# Patient Record
Sex: Female | Born: 1965 | Race: White | Hispanic: No | Marital: Married | Smoking: Never smoker
Health system: Southern US, Community
[De-identification: ages and names within clinical notes are randomized; demographics above are authoritative.]

## PROBLEM LIST (undated history)

## (undated) DIAGNOSIS — R51 Headache: Secondary | ICD-10-CM

## (undated) DIAGNOSIS — D561 Beta thalassemia: Secondary | ICD-10-CM

## (undated) DIAGNOSIS — G8929 Other chronic pain: Secondary | ICD-10-CM

## (undated) DIAGNOSIS — Z3183 Encounter for assisted reproductive fertility procedure cycle: Secondary | ICD-10-CM

## (undated) DIAGNOSIS — T7840XA Allergy, unspecified, initial encounter: Secondary | ICD-10-CM

## (undated) DIAGNOSIS — G4700 Insomnia, unspecified: Secondary | ICD-10-CM

## (undated) DIAGNOSIS — R519 Headache, unspecified: Secondary | ICD-10-CM

## (undated) DIAGNOSIS — D569 Thalassemia, unspecified: Secondary | ICD-10-CM

## (undated) DIAGNOSIS — D131 Benign neoplasm of stomach: Secondary | ICD-10-CM

## (undated) DIAGNOSIS — J302 Other seasonal allergic rhinitis: Secondary | ICD-10-CM

## (undated) HISTORY — PX: WISDOM TOOTH EXTRACTION: SHX21

## (undated) HISTORY — PX: OTHER SURGICAL HISTORY: SHX169

## (undated) HISTORY — DX: Headache, unspecified: R51.9

## (undated) HISTORY — DX: Other chronic pain: G89.29

## (undated) HISTORY — DX: Headache: R51

## (undated) HISTORY — PX: UPPER GASTROINTESTINAL ENDOSCOPY: SHX188

## (undated) HISTORY — DX: Beta thalassemia: D56.1

## (undated) HISTORY — DX: Thalassemia, unspecified: D56.9

## (undated) HISTORY — DX: Other seasonal allergic rhinitis: J30.2

## (undated) HISTORY — DX: Allergy, unspecified, initial encounter: T78.40XA

## (undated) HISTORY — DX: Encounter for assisted reproductive fertility procedure cycle: Z31.83

## (undated) HISTORY — DX: Benign neoplasm of stomach: D13.1

---

## 1998-07-11 ENCOUNTER — Inpatient Hospital Stay (HOSPITAL_COMMUNITY): Admission: AD | Admit: 1998-07-11 | Discharge: 1998-07-11 | Payer: Self-pay | Admitting: Gynecology

## 1998-10-06 ENCOUNTER — Inpatient Hospital Stay (HOSPITAL_COMMUNITY): Admission: AD | Admit: 1998-10-06 | Discharge: 1998-10-06 | Payer: Self-pay | Admitting: Gynecology

## 1998-10-07 ENCOUNTER — Inpatient Hospital Stay (HOSPITAL_COMMUNITY): Admission: AD | Admit: 1998-10-07 | Discharge: 1998-10-07 | Payer: Self-pay | Admitting: Gynecology

## 1998-10-08 ENCOUNTER — Inpatient Hospital Stay (HOSPITAL_COMMUNITY): Admission: AD | Admit: 1998-10-08 | Discharge: 1998-10-08 | Payer: Self-pay | Admitting: Gynecology

## 1998-10-10 ENCOUNTER — Inpatient Hospital Stay (HOSPITAL_COMMUNITY): Admission: AD | Admit: 1998-10-10 | Discharge: 1998-10-10 | Payer: Self-pay | Admitting: Gynecology

## 1998-11-08 ENCOUNTER — Other Ambulatory Visit: Admission: RE | Admit: 1998-11-08 | Discharge: 1998-11-08 | Payer: Self-pay | Admitting: Gynecology

## 1998-12-06 ENCOUNTER — Other Ambulatory Visit: Admission: RE | Admit: 1998-12-06 | Discharge: 1998-12-06 | Payer: Self-pay | Admitting: Gynecology

## 1999-01-19 ENCOUNTER — Inpatient Hospital Stay (HOSPITAL_COMMUNITY): Admission: AD | Admit: 1999-01-19 | Discharge: 1999-01-19 | Payer: Self-pay | Admitting: Gynecology

## 1999-01-20 ENCOUNTER — Inpatient Hospital Stay (HOSPITAL_COMMUNITY): Admission: AD | Admit: 1999-01-20 | Discharge: 1999-01-20 | Payer: Self-pay | Admitting: Gynecology

## 1999-01-21 ENCOUNTER — Inpatient Hospital Stay (HOSPITAL_COMMUNITY): Admission: AD | Admit: 1999-01-21 | Discharge: 1999-01-21 | Payer: Self-pay | Admitting: Gynecology

## 1999-01-23 ENCOUNTER — Inpatient Hospital Stay (HOSPITAL_COMMUNITY): Admission: AD | Admit: 1999-01-23 | Discharge: 1999-01-23 | Payer: Self-pay | Admitting: Gynecology

## 1999-01-31 ENCOUNTER — Inpatient Hospital Stay (HOSPITAL_COMMUNITY): Admission: AD | Admit: 1999-01-31 | Discharge: 1999-01-31 | Payer: Self-pay | Admitting: Gynecology

## 1999-05-11 ENCOUNTER — Inpatient Hospital Stay (HOSPITAL_COMMUNITY): Admission: AD | Admit: 1999-05-11 | Discharge: 1999-05-11 | Payer: Self-pay | Admitting: Gynecology

## 1999-05-12 ENCOUNTER — Inpatient Hospital Stay (HOSPITAL_COMMUNITY): Admission: AD | Admit: 1999-05-12 | Discharge: 1999-05-12 | Payer: Self-pay | Admitting: Gynecology

## 1999-05-13 ENCOUNTER — Inpatient Hospital Stay (HOSPITAL_COMMUNITY): Admission: AD | Admit: 1999-05-13 | Discharge: 1999-05-13 | Payer: Self-pay | Admitting: Gynecology

## 2000-01-07 ENCOUNTER — Other Ambulatory Visit: Admission: RE | Admit: 2000-01-07 | Discharge: 2000-01-07 | Payer: Self-pay | Admitting: Gynecology

## 2000-04-24 ENCOUNTER — Encounter: Payer: Self-pay | Admitting: *Deleted

## 2000-04-24 ENCOUNTER — Inpatient Hospital Stay (HOSPITAL_COMMUNITY): Admission: AD | Admit: 2000-04-24 | Discharge: 2000-04-24 | Payer: Self-pay | Admitting: Gynecology

## 2001-02-11 ENCOUNTER — Other Ambulatory Visit: Admission: RE | Admit: 2001-02-11 | Discharge: 2001-02-11 | Payer: Self-pay | Admitting: Obstetrics and Gynecology

## 2001-02-16 ENCOUNTER — Encounter: Admission: RE | Admit: 2001-02-16 | Discharge: 2001-02-16 | Payer: Self-pay | Admitting: Obstetrics and Gynecology

## 2001-02-16 ENCOUNTER — Encounter: Payer: Self-pay | Admitting: Obstetrics and Gynecology

## 2001-08-21 ENCOUNTER — Inpatient Hospital Stay (HOSPITAL_COMMUNITY): Admission: AD | Admit: 2001-08-21 | Discharge: 2001-08-21 | Payer: Self-pay | Admitting: Obstetrics and Gynecology

## 2001-08-21 ENCOUNTER — Inpatient Hospital Stay (HOSPITAL_COMMUNITY): Admission: AD | Admit: 2001-08-21 | Discharge: 2001-08-26 | Payer: Self-pay | Admitting: Obstetrics and Gynecology

## 2001-08-27 ENCOUNTER — Encounter: Admission: RE | Admit: 2001-08-27 | Discharge: 2001-09-26 | Payer: Self-pay | Admitting: Obstetrics and Gynecology

## 2001-08-29 ENCOUNTER — Inpatient Hospital Stay (HOSPITAL_COMMUNITY): Admission: AD | Admit: 2001-08-29 | Discharge: 2001-08-29 | Payer: Self-pay | Admitting: Obstetrics and Gynecology

## 2001-09-17 ENCOUNTER — Encounter: Payer: Self-pay | Admitting: Obstetrics and Gynecology

## 2001-09-17 ENCOUNTER — Encounter: Admission: RE | Admit: 2001-09-17 | Discharge: 2001-09-17 | Payer: Self-pay | Admitting: Obstetrics and Gynecology

## 2001-10-08 ENCOUNTER — Other Ambulatory Visit: Admission: RE | Admit: 2001-10-08 | Discharge: 2001-10-08 | Payer: Self-pay | Admitting: Obstetrics and Gynecology

## 2002-10-14 ENCOUNTER — Other Ambulatory Visit: Admission: RE | Admit: 2002-10-14 | Discharge: 2002-10-14 | Payer: Self-pay | Admitting: Obstetrics and Gynecology

## 2003-10-27 ENCOUNTER — Other Ambulatory Visit: Admission: RE | Admit: 2003-10-27 | Discharge: 2003-10-27 | Payer: Self-pay | Admitting: Obstetrics and Gynecology

## 2004-09-26 ENCOUNTER — Other Ambulatory Visit: Admission: RE | Admit: 2004-09-26 | Discharge: 2004-09-26 | Payer: Self-pay | Admitting: Obstetrics and Gynecology

## 2005-07-17 ENCOUNTER — Ambulatory Visit: Payer: Self-pay | Admitting: Gastroenterology

## 2005-07-31 ENCOUNTER — Encounter (INDEPENDENT_AMBULATORY_CARE_PROVIDER_SITE_OTHER): Payer: Self-pay | Admitting: Specialist

## 2005-07-31 ENCOUNTER — Ambulatory Visit: Payer: Self-pay | Admitting: Gastroenterology

## 2005-10-01 ENCOUNTER — Other Ambulatory Visit: Admission: RE | Admit: 2005-10-01 | Discharge: 2005-10-01 | Payer: Self-pay | Admitting: Obstetrics and Gynecology

## 2008-06-15 ENCOUNTER — Ambulatory Visit: Payer: Self-pay | Admitting: Gastroenterology

## 2008-06-15 DIAGNOSIS — K219 Gastro-esophageal reflux disease without esophagitis: Secondary | ICD-10-CM | POA: Insufficient documentation

## 2008-06-16 ENCOUNTER — Encounter: Payer: Self-pay | Admitting: Gastroenterology

## 2008-06-16 ENCOUNTER — Telehealth: Payer: Self-pay | Admitting: Gastroenterology

## 2009-08-23 ENCOUNTER — Telehealth: Payer: Self-pay | Admitting: Gastroenterology

## 2010-03-12 ENCOUNTER — Encounter: Admission: RE | Admit: 2010-03-12 | Discharge: 2010-03-12 | Payer: Self-pay | Admitting: Obstetrics and Gynecology

## 2010-11-18 ENCOUNTER — Encounter: Payer: Self-pay | Admitting: Obstetrics and Gynecology

## 2011-03-15 NOTE — Discharge Summary (Signed)
[facility]  Patient:    DARSI, TIEN Visit Number: [id number] MRN: [id number]          Service Type: OBS Location: 910A [id number] [id number] Attending Physician:  Frederich Balding Dictated by:   Danie Chandler, R.N. Admit Date:  08/21/2001 Discharge Date: 08/26/2001                             Discharge Summary  ADMISSION DIAGNOSIS:          Intrauterine pregnancy at term with spontaneous onset of labor.  DISCHARGE DIAGNOSIS:          Intrauterine pregnancy at term with spontaneous onset of labor with failure to descend.  PROCEDURES:                   On August 22, 2001, primary low transverse cesarean section.  REASON FOR ADMISSION:         The patient is a 45 year old, gravida 3, para 0, married, white female who presented at 47 weeks with spontaneous onset of labor.  She progressed to complete dilatation.  After two-and-a-half hours of pushing, the fetal vertex remained arrested at 0 to +1 station with minimal descent.  After discussion of options, including vacuum extraction, it was decided to proceed with a primary cesarean section.  HOSPITAL COURSE:              The patient was taken to the operating room and underwent the above-named procedure without complication.  This was productive of a viable female infant with Apgars of 9 at one minute and 9 at five minutes. Postoperatively on day #1, the patient was stable.  Her hemoglobin on this day was 8.0, hematocrit 24.3, and white blood cell count 13.8.  On postoperative day #2, the patient was complaining of gas pain.  She was tolerating a regular diet without nausea or vomiting.  She did have a good return of bowel function.  Her hemoglobin was stable at 8.3.  On postoperative day #3, she continued with some gas pain, but did feel better as the  day progressed.  DISPOSITION:                  On postoperative day #4, she was discharged home.  CONDITION ON DISCHARGE:       Good.  DIET:                          Regular as tolerated.  FOLLOW-UP:                    She is to follow up in the office in one to two weeks for an incision check.  SPECIAL INSTRUCTIONS:         She is to call for a temperature greater than 100 degrees, persistent nausea or vomiting, heavy vaginal bleeding, and/or redness or drainage from the incision site.  DISCHARGE MEDICATIONS:        1. Prenatal vitamins one p.o. q.d.                               2. Motrin 600 mg, #30, one p.o. q.6h. p.r.n.  pain.                               3. Percocet 5 mg, #40, one to two p.o. q.4h.                                  p.r.n. pain. Dictated by:   Danie Chandler, R.N. Attending Physician:  Frederich Balding DD:  08/26/01 TD:  08/26/01 Job: [id number]

## 2011-03-15 NOTE — Op Note (Signed)
[facility]  Patient:    Angela Mayo, Angela Mayo Visit Number: [id number] MRN: [id number]          Service Type: OBS Location: [facility]C Attending Physician:  Frederich Balding Dictated by:   Juluis Mire, M.D. Proc. Date: 08/22/01 Admit Date:  08/21/2001 Discharge Date: 08/21/2001                             Operative Report  PREOPERATIVE DIAGNOSIS:       Intrauterine pregnancy at term with failure to                               descend.  POSTOPERATIVE DIAGNOSIS:      Intrauterine pregnancy at term with failure to                               descend.  OPERATION:                    Low transverse cesarean section.  SURGEON:                      Juluis Mire, M.D.  ANESTHESIA:                   Epidural.  ESTIMATED BLOOD LOSS:         800 cc.  PACKS AND DRAINS:             None.  INTRAOPERATIVE BLOOD PRODUCTS:                     None.  COMPLICATIONS:                None.  INDICATIONS:                  A 45 year old gravida 3, para 0, abortus 2, married white female who presents at 34 weeks with spontaneous onset of labor. Progressed to complete dilatation.  After 2-1/2 hours of pushing, the fetal vertex remained arrestive at 0 to +1 station with minimal descent.  After discussion of options including vacuum extraction, it was decided to proceed with primary cesarean section.  The risks were discussed including the risks of anesthesia, the risks of infection, the risks of hemorrhage that could necessitate transfusion, the risks of AIDS or hepatitis, the risks of injury to adjacent organs, the risks of deep venous thrombosis, and pulmonary embolus.  DESCRIPTION OF PROCEDURE:     The patient was taken to the OR and placed in the supine position with left lateral tilt.  After a satisfactory level of epidural anesthesia was obtained, the abdomen was prepped out with Betadine and draped as a sterile field.  A low transverse skin incision was  made with the knife and carried through the subcutaneous tissue.  The anterior rectus fascia was entered sharply and the incision ________ laterally.  The fascia was then taken off the muscle superiorly and inferiorly.  Rectus muscles were separated in the midline.  The anterior peritoneum was entered sharply ______ the peritoneum extended both superiorly and inferiorly.  A low transverse bladder flap was easily developed.  A low transverse uterine incision was developed with the knife and extended laterally using Mayo traction.  The infant presented in the vertex presentation.  With total elevation  of the head and fundal pressure, there was a double loop of nuchal cord that was tied around the neck.  The placenta was then delivered manually.  The uterus was wiped free of remaining membranes and placenta.  The uterus was then closed with an interlocking suture of 0 chromic using two layer closure technique. Areas of bleeding brought under control with figure-of-eights of 0 chromic. Tubes and ovaries were unremarkable.  The pelvic cavity was thoroughly irrigated and hemostasis had been excellent.  The muscles were reapproximated with a running suture of 3-0 Vicryl.  The fascia was closed with a running suture of 0 PDS.  The skin was closed with staples and Steri-Strips.  Urine output was slightly blood tinged at the time of closure.  Sponge, instrument, and needle counts was correct by the circulating nurse.  The patient tolerated the procedure well and was returned to the recovery room in good condition. Dictated by:   Juluis Mire, M.D. Attending Physician:  Frederich Balding DD:  08/22/01 TD:  08/24/01 Job: [id number]

## 2011-03-15 NOTE — H&P (Signed)
[facility]  Patient:    Angela Mayo, Angela Mayo                        MRN: [id number] Adm. Date:  [id number] Attending:  Rolinda Roan CC:         Leatha Gilding. Mezer, M.D.                         History and Physical  HISTORY OF PRESENT ILLNESS:   The patient is a 45 year old, gravida 2, para 0, ho underwent embryo transfer on March 29, 2000, in association with Charna Elizabeth, M.D. of [place]. She is seen locally by Leatha Gilding. Mezer, M.D.  The patient began some  cramping and spotting on the afternoon of April 23, 2000, which became bright red, then brownish, and then more consistently red and heavier in the afternoon of June 28.  She presented in maternity admissions unit at [facility] at approximately 1815 on April 24, 2000.  Beta hCG at this time was 2637 as opposed to previous level of approximately 360 on April 17, 2000.  Examination of the patient revealed well-nourished white female in no acute distress.  PHYSICAL EXAMINATION:  VITAL SIGNS:                  Blood pressure 112/65, pulse ranged from 75 to 94. Temperature 98.8 to 99.5.  ABDOMEN:                      Flat and soft.  PELVIC:                       External genitalia, Bartholins, urethra, and Skenes glands showed slight red blood present.  Speculum examination revealed small amount of reddish blood present in the vagina.  Cervix was closed.  There was no active bleeding occurring.  Bimanual examination revealed the uterus to be midposterior and top normal size.  There were no palpable adnexal mass, nor was there any tenderness.  Ultrasound was obtained which failed to show an intrauterine pregnancy at this time, but did not show any adnexal mass or suggestion of ectopic pregnancy or of fluid in the cul-de-sac.  IMPRESSION:                   1. Positive beta hCG.                               2. Question intrauterine pregnancy.  3. Abnormal uterine bleeding.  DISPOSITION:                  The patient was advised to return home and remain at bed rest with bathroom privileges.  She is to call the office on the morning of  June 29, to consult with Leatha Gilding. Mezer, M.D. and staff regarding her situation. She was to proceed to [facility] to see Dr. Neil Crouch for an ultrasound on June 29; however, it was advised that because of the lack of definite fetal pole or development of a fetus on the ultrasound at this time, she might remain in town and consult with Dimas Aguas C. Mezer, M.D.  She is to call or return to maternity admissions unit if further heavy bleeding or cramps should occur.  She is to continue her progesterone suppositories as ordered. DD:  04/24/00 TD:  04/25/00 Job: [id number]

## 2011-10-31 ENCOUNTER — Telehealth: Payer: Self-pay | Admitting: Gastroenterology

## 2011-10-31 NOTE — Telephone Encounter (Signed)
Patient c/o pain that started in the fall after she went for a walk.  She reports that the pain is worse with position changes standing up or sitting down.  Once she is standing or sitting the pain subsides.  She was told by her primary care MD that she had a distended colon on an [place] that was performed in her office.  She has a history of endometriosis and had exp lap about 16 years ago.  She was told that she had a lot of gas and stool in her colon and was told to take Miralax.  She reports she is not constipated and the miralax just made her stools very soft and had several a day.  She denies diarrhea, constipation, nausea, vomiting or fever.  She does take an OTC digestive enzyme with each meal and this helps with her indigestion.  She will keep the appt on 11/11/11, I will place her on the cancellation list.

## 2011-11-11 ENCOUNTER — Ambulatory Visit (INDEPENDENT_AMBULATORY_CARE_PROVIDER_SITE_OTHER): Payer: BC Managed Care – PPO | Admitting: Gastroenterology

## 2011-11-11 ENCOUNTER — Other Ambulatory Visit (INDEPENDENT_AMBULATORY_CARE_PROVIDER_SITE_OTHER): Payer: BC Managed Care – PPO

## 2011-11-11 ENCOUNTER — Encounter: Payer: Self-pay | Admitting: Gastroenterology

## 2011-11-11 VITALS — BP 108/64 | HR 72 | Ht 70.0 in | Wt 154.0 lb

## 2011-11-11 DIAGNOSIS — R141 Gas pain: Secondary | ICD-10-CM

## 2011-11-11 DIAGNOSIS — R142 Eructation: Secondary | ICD-10-CM

## 2011-11-11 DIAGNOSIS — R143 Flatulence: Secondary | ICD-10-CM

## 2011-11-11 DIAGNOSIS — R1032 Left lower quadrant pain: Secondary | ICD-10-CM

## 2011-11-11 LAB — COMPREHENSIVE METABOLIC PANEL
ALT: 15 U/L (ref 0–35)
AST: 17 U/L (ref 0–37)
Albumin: 4.4 g/dL (ref 3.5–5.2)
CO2: 27 mEq/L (ref 19–32)
Calcium: 8.9 mg/dL (ref 8.4–10.5)
Creatinine, Ser: 0.6 mg/dL (ref 0.4–1.2)
Glucose, Bld: 88 mg/dL (ref 70–99)
Sodium: 135 mEq/L (ref 135–145)
Total Bilirubin: 0.8 mg/dL (ref 0.3–1.2)

## 2011-11-11 LAB — CBC WITH DIFFERENTIAL/PLATELET
Basophils Absolute: 0 10*3/uL (ref 0.0–0.1)
Eosinophils Relative: 3.4 % (ref 0.0–5.0)
HCT: 36.6 % (ref 36.0–46.0)
Hemoglobin: 11.7 g/dL — ABNORMAL LOW (ref 12.0–15.0)
Lymphs Abs: 1.8 10*3/uL (ref 0.7–4.0)
MCV: 65.7 fl — ABNORMAL LOW (ref 78.0–100.0)
Monocytes Absolute: 0.6 10*3/uL (ref 0.1–1.0)
Neutro Abs: 3.5 10*3/uL (ref 1.4–7.7)
Platelets: 230 10*3/uL (ref 150.0–400.0)
RDW: 15.4 % — ABNORMAL HIGH (ref 11.5–14.6)

## 2011-11-11 MED ORDER — ALIGN 4 MG PO CAPS: 1.0000 | ORAL_CAPSULE | Freq: Every day | ORAL | Status: DC

## 2011-11-11 NOTE — Patient Instructions (Addendum)
Go directly to the basement to have your labs drawn today. Start Align samples one tablet by mouth once daily x 1 month.  Follow instructions on Hemoccult cards and mail back to [place] when finished. Low gas diet given. Patient advised to avoid spicy, acidic, citrus, chocolate, mints, fruit and fruit juices.  Limit the intake of caffeine, alcohol and Soda.  Don't exercise too soon after eating.  Don't lie down within 3-4 hours of eating.  Elevate the head of your bed. cc: Julio Sicks, NP

## 2011-11-11 NOTE — Progress Notes (Signed)
History of Present Illness: This is a 46 year old female seen in the past for GERD. She has had chronic problems with postprandial bloating and increased gas. The symptoms are well controlled with dietary measures and a digestive enzyme. Acid suppressants have not been effective in controlling her gas and bloating. She developed left hip and left lower quadrant abdominal pain that is brought on by movement, bending and certain positions of her left hip. Does not appear to have any relationship to any digestive function. Denies weight loss, abdominal pain, constipation, diarrhea, change in stool caliber, melena, hematochezia, nausea, vomiting, dysphagia, reflux symptoms, chest pain.  No Known Allergies No outpatient prescriptions prior to visit.   Past Medical History  Diagnosis Date  . GERD (gastroesophageal reflux disease)   . Beta thalassemia   . Seasonal allergies   . Chronic headaches   . Benign fundic gland polyps of stomach   . Endometriosis    Past Surgical History  Procedure Date  . Cesarean section    History   Social History  . Marital Status: Married    Spouse Name: N/A    Number of Children: N/A  . Years of Education: N/A   Occupational History  . Violinist with [employer]    Social History Main Topics  . Smoking status: Never Smoker   . Smokeless tobacco: Never Used  . Alcohol Use: Yes     occasional  . Drug Use: No  . Sexually Active: None   Other Topics Concern  . None   Social History Narrative   Daily caffeine    Family History  Problem Relation Age of Onset  . Adopted: Yes  . Pancreatic cancer Maternal Uncle   . Colon cancer Neg Hx     Review of Systems: Pertinent positive and negative review of systems were noted in the above HPI section. All other review of systems were otherwise negative.   Physical Exam: General: Well developed , well nourished, no acute distress Head: Normocephalic and atraumatic Eyes:  sclerae anicteric,  EOMI Ears: Normal auditory acuity Mouth: No deformity or lesions Neck: Supple, no masses or thyromegaly Lungs: Clear throughout to auscultation Heart: Regular rate and rhythm; no murmurs, rubs or bruits Abdomen: Soft, non tender and non distended. Tenderness localized to left iliac crest. No masses, hepatosplenomegaly or hernias noted. Normal Bowel sounds Musculoskeletal: Symmetrical with no gross deformities  Skin: No lesions on visible extremities Pulses:  Normal pulses noted Extremities: No clubbing, cyanosis, edema or deformities noted Neurological: Alert oriented x 4, grossly nonfocal Cervical Nodes:  No significant cervical adenopathy Inguinal Nodes: No significant inguinal adenopathy Psychological:  Alert and cooperative. Normal mood and affect  Assessment and Recommendations:  1. Left hip region musculoskeletal pain. Does not appear to be a gastrointestinal process. Obtain routine blood work and stool Hemoccults. Consider further evaluation with an abdominal/pelvic CT scan. Further followup with her primary physician. Consider orthopedic evaluation.  2. Bloating and intestinal gas. She is given information on antireflux measures and a low gas diet. Trial of Align and Restora probiotics. Followup as needed.

## 2011-11-12 LAB — IBC PANEL: Iron: 118 ug/dL (ref 42–145)

## 2013-03-19 ENCOUNTER — Other Ambulatory Visit: Payer: Self-pay | Admitting: Obstetrics and Gynecology

## 2013-03-26 ENCOUNTER — Other Ambulatory Visit: Payer: Self-pay | Admitting: Obstetrics and Gynecology

## 2013-03-26 DIAGNOSIS — R928 Other abnormal and inconclusive findings on diagnostic imaging of breast: Secondary | ICD-10-CM

## 2013-04-05 ENCOUNTER — Other Ambulatory Visit: Payer: BC Managed Care – PPO

## 2013-04-08 ENCOUNTER — Ambulatory Visit
Admission: RE | Admit: 2013-04-08 | Discharge: 2013-04-08 | Disposition: A | Discharge status: Home / Self Care | Payer: BC Managed Care – PPO | Source: Ambulatory Visit | Attending: Obstetrics and Gynecology | Admitting: Obstetrics and Gynecology

## 2013-04-08 DIAGNOSIS — R928 Other abnormal and inconclusive findings on diagnostic imaging of breast: Secondary | ICD-10-CM

## 2013-09-29 ENCOUNTER — Emergency Department (INDEPENDENT_AMBULATORY_CARE_PROVIDER_SITE_OTHER): Payer: BC Managed Care – PPO

## 2013-09-29 ENCOUNTER — Encounter: Payer: Self-pay | Admitting: Emergency Medicine

## 2013-09-29 ENCOUNTER — Emergency Department
Admission: EM | Admit: 2013-09-29 | Discharge: 2013-09-29 | Disposition: A | Discharge status: Home / Self Care | Payer: BC Managed Care – PPO | Source: Home / Self Care | Attending: Family Medicine | Admitting: Family Medicine

## 2013-09-29 DIAGNOSIS — M79609 Pain in unspecified limb: Secondary | ICD-10-CM

## 2013-09-29 DIAGNOSIS — M7989 Other specified soft tissue disorders: Secondary | ICD-10-CM

## 2013-09-29 DIAGNOSIS — S93609A Unspecified sprain of unspecified foot, initial encounter: Secondary | ICD-10-CM

## 2013-09-29 DIAGNOSIS — S93601A Unspecified sprain of right foot, initial encounter: Secondary | ICD-10-CM

## 2013-09-29 HISTORY — DX: Insomnia, unspecified: G47.00

## 2013-09-29 MED ORDER — IBUPROFEN 800 MG PO TABS
800.0000 mg | ORAL_TABLET | Freq: Once | ORAL | Status: AC
  Administered 2013-09-29: 800 mg via ORAL

## 2013-09-29 MED ORDER — HYDROCODONE-ACETAMINOPHEN 5-325 MG PO TABS: ORAL_TABLET | ORAL | Status: DC

## 2013-09-29 MED ORDER — MELOXICAM 15 MG PO TABS: 15.0000 mg | ORAL_TABLET | Freq: Every day | ORAL | Status: DC

## 2013-09-29 NOTE — ED Provider Notes (Signed)
CSN: [id number]     Arrival date & time 09/29/13  6962 History   First MD Initiated Contact with Patient 09/29/13 [id number]     Chief Complaint  Patient presents with  . Foot Injury      HPI Comments: Patient twisted her right foot getting out of bed last night, with subsequent pain/swelling dorsally.   Patient is a 47 y.o. female presenting with foot injury. The history is provided by the patient and the spouse.  Foot Injury Location:  Foot Time since incident:  8 hours Injury: yes   Mechanism of injury comment:  Twisted foot Foot location:  R foot Pain details:    Quality:  Aching   Radiates to:  Does not radiate   Severity:  Moderate   Onset quality:  Sudden   Duration:  8 hours   Timing:  Constant   Progression:  Unchanged Chronicity:  New Dislocation: no   Prior injury to area:  No Relieved by:  NSAIDs Worsened by:  Bearing weight Ineffective treatments:  None tried Associated symptoms: decreased ROM, stiffness and swelling   Associated symptoms: no back pain, no muscle weakness, no numbness and no tingling     Past Medical History  Diagnosis Date  . GERD (gastroesophageal reflux disease)   . Beta thalassemia   . Seasonal allergies   . Chronic headaches   . Benign fundic gland polyps of stomach   . Endometriosis   . Insomnia    Past Surgical History  Procedure Laterality Date  . Cesarean section     Family History  Problem Relation Age of Onset  . Adopted: Yes  . Pancreatic cancer Maternal Uncle   . Colon cancer Neg Hx    History  Substance Use Topics  . Smoking status: Never Smoker   . Smokeless tobacco: Never Used  . Alcohol Use: No     Comment: occasional   OB History   Grav Para Term Preterm Abortions TAB SAB Ect Mult Living                 Review of Systems  Musculoskeletal: Positive for stiffness. Negative for back pain.  All other systems reviewed and are negative.    Allergies  Review of patient's allergies indicates no known  allergies.  Home Medications   Current Outpatient Rx  Name  Route  Sig  Dispense  Refill  . traZODone (DESYREL) 150 MG tablet   Oral   Take by mouth at bedtime.         . Digestive Enzymes CAPS   Oral   Take by mouth. Takes one capsule before every meal         . HYDROcodone-acetaminophen (NORCO/VICODIN) 5-325 MG per tablet      Take one by mouth at bedtime as needed for pain   10 tablet   0   . meloxicam (MOBIC) 15 MG tablet   Oral   Take 1 tablet (15 mg total) by mouth daily. Take with food each morning   15 tablet   0   . Probiotic Product (ALIGN) 4 MG CAPS   Oral   Take 1 capsule by mouth daily.   30 capsule   0     Samples given to patient    Lot#[id number]         ...    BP 97/62  Pulse 78  Temp(Src) 98 F (36.7 C) (Oral)  Resp 16  Ht 5\' 10"  (1.778 m)  Wt 133 lb (60.328  kg)  BMI 19.08 kg/m2  SpO2 99%  LMP 09/05/2013 Physical Exam  Nursing note and vitals reviewed. Constitutional: She is oriented to person, place, and time. She appears well-developed and well-nourished. No distress.  HENT:  Head: Normocephalic and atraumatic.  Eyes: Conjunctivae are normal. Pupils are equal, round, and reactive to light.  Musculoskeletal:       Right foot: She exhibits decreased range of motion, tenderness, bony tenderness and swelling. She exhibits normal capillary refill, no crepitus, no deformity and no laceration.       Feet:  There is tenderness/swelling dorsum as noted on diagram.  Neurological: She is alert and oriented to person, place, and time.  Skin: Skin is warm and dry.    ED Course  Procedures  none    Imaging Review Dg Foot Complete Right  09/29/2013   CLINICAL DATA:  Foot injury.  Pain and swelling  EXAM: RIGHT FOOT COMPLETE - 3+ VIEW  COMPARISON:  None.  FINDINGS: There is no evidence of fracture or dislocation. There is no evidence of arthropathy or other focal bone abnormality. Soft tissues are unremarkable.  IMPRESSION: Negative.    Electronically Signed   By: Marlan Palau M.D.   On: 09/29/2013 10:55      MDM   1. Right foot sprain, initial encounter    Ace wrap applied.  Rx for Mobic.15mg .  Apply ice pack for 30 minutes every 1 to 2 hours today and tomorrow.  Elevate.  Use crutches for 3 to 5 days.  Wear Ace wrap until swelling decreases.  Begin range of motion and stretching exercises in about 5 days as per instruction sheet.  Followup with [facility] if not improving about two weeks.    Lattie Haw, MD 10/02/13 1316

## 2013-09-29 NOTE — ED Notes (Addendum)
Pt c/o RT foot injury x last night. She reports that she tripped during the night going to the restroom at home. She took advil at 19.

## 2013-10-03 ENCOUNTER — Telehealth: Payer: Self-pay

## 2013-10-03 NOTE — ED Notes (Signed)
Left a message on voice mail asking how patient is feeling and advising to call back with any questions or concerns.  

## 2014-02-14 ENCOUNTER — Other Ambulatory Visit: Payer: Self-pay

## 2014-02-14 DIAGNOSIS — Z1231 Encounter for screening mammogram for malignant neoplasm of breast: Secondary | ICD-10-CM

## 2014-03-31 ENCOUNTER — Encounter (INDEPENDENT_AMBULATORY_CARE_PROVIDER_SITE_OTHER): Payer: Self-pay

## 2014-03-31 ENCOUNTER — Ambulatory Visit
Admission: RE | Admit: 2014-03-31 | Discharge: 2014-03-31 | Disposition: A | Discharge status: Home / Self Care | Payer: BC Managed Care – PPO | Source: Ambulatory Visit

## 2014-03-31 DIAGNOSIS — Z1231 Encounter for screening mammogram for malignant neoplasm of breast: Secondary | ICD-10-CM

## 2014-05-06 DIAGNOSIS — M67911 Unspecified disorder of synovium and tendon, right shoulder: Secondary | ICD-10-CM | POA: Insufficient documentation

## 2014-05-06 DIAGNOSIS — M7551 Bursitis of right shoulder: Secondary | ICD-10-CM

## 2014-05-06 DIAGNOSIS — M719 Bursopathy, unspecified: Secondary | ICD-10-CM | POA: Insufficient documentation

## 2014-05-26 ENCOUNTER — Other Ambulatory Visit: Payer: Self-pay | Admitting: Obstetrics and Gynecology

## 2014-05-27 LAB — CYTOLOGY - PAP

## 2015-03-03 ENCOUNTER — Other Ambulatory Visit: Payer: Self-pay

## 2015-03-03 DIAGNOSIS — Z1231 Encounter for screening mammogram for malignant neoplasm of breast: Secondary | ICD-10-CM

## 2015-06-02 ENCOUNTER — Other Ambulatory Visit: Payer: Self-pay | Admitting: Obstetrics and Gynecology

## 2015-06-02 ENCOUNTER — Ambulatory Visit
Admission: RE | Admit: 2015-06-02 | Discharge: 2015-06-02 | Disposition: A | Discharge status: Home / Self Care | Payer: 59 | Source: Ambulatory Visit

## 2015-06-02 DIAGNOSIS — Z1231 Encounter for screening mammogram for malignant neoplasm of breast: Secondary | ICD-10-CM

## 2015-06-05 LAB — CYTOLOGY - PAP

## 2016-02-05 ENCOUNTER — Encounter: Payer: Self-pay | Admitting: Gastroenterology

## 2016-03-14 ENCOUNTER — Other Ambulatory Visit: Payer: Self-pay

## 2016-03-14 DIAGNOSIS — Z1231 Encounter for screening mammogram for malignant neoplasm of breast: Secondary | ICD-10-CM

## 2016-04-04 ENCOUNTER — Ambulatory Visit (AMBULATORY_SURGERY_CENTER): Payer: Self-pay

## 2016-04-04 VITALS — Ht 70.0 in | Wt 140.4 lb

## 2016-04-04 DIAGNOSIS — Z1211 Encounter for screening for malignant neoplasm of colon: Secondary | ICD-10-CM

## 2016-04-04 MED ORDER — SUPREP BOWEL PREP KIT 17.5-3.13-1.6 GM/177ML PO SOLN: 1.0000 | Freq: Once | ORAL | Status: DC

## 2016-04-04 NOTE — Progress Notes (Signed)
No allergies to eggs or soy No past problems with anesthesia No home oxygen No diet meds  Has email and internet; registered for emmi 

## 2016-04-18 ENCOUNTER — Ambulatory Visit (AMBULATORY_SURGERY_CENTER): Payer: BLUE CROSS/BLUE SHIELD | Admitting: Gastroenterology

## 2016-04-18 ENCOUNTER — Encounter: Payer: Self-pay | Admitting: Gastroenterology

## 2016-04-18 VITALS — BP 100/56 | HR 55 | Temp 98.6°F | Resp 13 | Ht 70.0 in | Wt 154.0 lb

## 2016-04-18 DIAGNOSIS — Z1211 Encounter for screening for malignant neoplasm of colon: Secondary | ICD-10-CM | POA: Diagnosis not present

## 2016-04-18 MED ORDER — SODIUM CHLORIDE 0.9 % IV SOLN: 500.0000 mL | INTRAVENOUS | Status: DC

## 2016-04-18 NOTE — Op Note (Addendum)
[facility] Patient Name: Angela Mayo Procedure Date: 04/18/2016 10:23 AM MRN: [id number] Endoscopist: Ladene Artist , MD Age: 50 Referring MD:  Date of Birth: 1966/06/20 Gender: Female Account #: [id number] Procedure:                Colonoscopy Indications:              Screening for colorectal malignant neoplasm Medicines:                Monitored Anesthesia Care Procedure:                Pre-Anesthesia Assessment:                           - Prior to the procedure, a History and Physical                            was performed, and patient medications and                            allergies were reviewed. The patient's tolerance of                            previous anesthesia was also reviewed. The risks                            and benefits of the procedure and the sedation                            options and risks were discussed with the patient.                            All questions were answered, and informed consent                            was obtained. Prior Anticoagulants: The patient has                            taken no previous anticoagulant or antiplatelet                            agents. ASA Grade Assessment: II - A patient with                            mild systemic disease. After reviewing the risks                            and benefits, the patient was deemed in                            satisfactory condition to undergo the procedure.                           After obtaining informed consent, the colonoscope  was passed under direct vision. Throughout the                            procedure, the patient's blood pressure, pulse, and                            oxygen saturations were monitored continuously. The                            Model PCF-[id number] scope was introduced                            through the anus and advanced to the the cecum,                            identified by  appendiceal orifice and ileocecal                            valve. The ileocecal valve, appendiceal orifice,                            and rectum were photographed. The quality of the                            bowel preparation was good. The colonoscopy was                            performed without difficulty. The patient tolerated                            the procedure well. Scope In: 10:39:41 AM Scope Out: 10:56:58 AM Scope Withdrawal Time: 0 hours 13 minutes 44 seconds  Total Procedure Duration: 0 hours 17 minutes 17 seconds  Findings:                 The entire examined colon appeared normal on direct                            and retroflexion views. Complications:            No immediate complications. Estimated blood loss:                            None. Estimated Blood Loss:     Estimated blood loss: none. Impression:               - The entire examined colon is normal on direct and                            retroflexion views.                           - No specimens collected. Recommendation:           - Repeat colonoscopy in 10 years for screening  purposes.                           - Patient has a contact number available for                            emergencies. The signs and symptoms of potential                            delayed complications were discussed with the                            patient. Return to normal activities tomorrow.                            Written discharge instructions were provided to the                            patient.                           - Resume previous diet.                           - Continue present medications. Ladene Artist, MD 04/18/2016 10:59:31 AM This report has been signed electronically.

## 2016-04-18 NOTE — Progress Notes (Signed)
Report to PACU, RN, vss, BBS= Clear.  

## 2016-04-18 NOTE — Patient Instructions (Signed)
YOU HAD AN ENDOSCOPIC PROCEDURE TODAY AT THE [facility]:   Refer to the procedure report that was given to you for any specific questions about what was found during the examination.  If the procedure report does not answer your questions, please call your gastroenterologist to clarify.  If you requested that your care partner not be given the details of your procedure findings, then the procedure report has been included in a sealed envelope for you to review at your convenience later.  YOU SHOULD EXPECT: Some feelings of bloating in the abdomen. Passage of more gas than usual.  Walking can help get rid of the air that was put into your GI tract during the procedure and reduce the bloating. If you had a lower endoscopy (such as a colonoscopy or flexible sigmoidoscopy) you may notice spotting of blood in your stool or on the toilet paper. If you underwent a bowel prep for your procedure, you may not have a normal bowel movement for a few days.  Please Note:  You might notice some irritation and congestion in your nose or some drainage.  This is from the oxygen used during your procedure.  There is no need for concern and it should clear up in a day or so.  SYMPTOMS TO REPORT IMMEDIATELY:   Following lower endoscopy (colonoscopy or flexible sigmoidoscopy):  Excessive amounts of blood in the stool  Significant tenderness or worsening of abdominal pains  Swelling of the abdomen that is new, acute  Fever of 100F or higher   For urgent or emergent issues, a gastroenterologist can be reached at any hour by calling [phone].   DIET: Your first meal following the procedure should be a small meal and then it is ok to progress to your normal diet. Heavy or fried foods are harder to digest and may make you feel nauseous or bloated.  Likewise, meals heavy in dairy and vegetables can increase bloating.  Drink plenty of fluids but you should avoid alcoholic beverages for 24  hours.  ACTIVITY:  You should plan to take it easy for the rest of today and you should NOT DRIVE or use heavy machinery until tomorrow (because of the sedation medicines used during the test).    FOLLOW UP: Our staff will call the number listed on your records the next business day following your procedure to check on you and address any questions or concerns that you may have regarding the information given to you following your procedure. If we do not reach you, we will leave a message.  However, if you are feeling well and you are not experiencing any problems, there is no need to return our call.  We will assume that you have returned to your regular daily activities without incident.  If any biopsies were taken you will be contacted by phone or by letter within the next 1-3 weeks.  Please call us at [phone] if you have not heard about the biopsies in 3 weeks.    SIGNATURES/CONFIDENTIALITY: You and/or your care partner have signed paperwork which will be entered into your electronic medical record.  These signatures attest to the fact that that the information above on your After Visit Summary has been reviewed and is understood.  Full responsibility of the confidentiality of this discharge information lies with you and/or your care-partner.   Resume medications. 

## 2016-04-18 NOTE — Progress Notes (Signed)
Pt reported that her birth mother had "a twisted colon and was unable to complete her colonoscopy".  Randall Hiss, RN reported this to Boyce Medici, [place] and she said Dr. Fuller Plan used a pediatric scope.  Also pt said her last BM in the waiting area was a light brownish color liquid.  She went in the rest room here in admitting and I looked at the result in the toilet which was a dark yellow color liquid.  Pt is very anxious about her prosedure and she had several questions.  I tried to answer all her questions and make her feel comfortable. maw

## 2016-04-19 ENCOUNTER — Telehealth: Payer: Self-pay | Admitting: *Deleted

## 2016-04-19 NOTE — Telephone Encounter (Signed)
  Follow up Call-  Call back number 04/18/2016  Post procedure Call Back phone  # [phone] hm  Permission to leave phone message Yes     Patient questions:  Do you have a fever, pain , or abdominal swelling? No. Pain Score  0 *  Have you tolerated food without any problems? Yes.    Have you been able to return to your normal activities? Yes.    Do you have any questions about your discharge instructions: Diet   No. Medications  No. Follow up visit  No.  Do you have questions or concerns about your Care? No.  Actions: * If pain score is 4 or above: No action needed, pain <4.

## 2016-06-24 ENCOUNTER — Other Ambulatory Visit: Payer: Self-pay | Admitting: Obstetrics and Gynecology

## 2016-06-24 ENCOUNTER — Ambulatory Visit
Admission: RE | Admit: 2016-06-24 | Discharge: 2016-06-24 | Disposition: A | Discharge status: Home / Self Care | Payer: BLUE CROSS/BLUE SHIELD | Source: Ambulatory Visit

## 2016-06-24 DIAGNOSIS — Z1231 Encounter for screening mammogram for malignant neoplasm of breast: Secondary | ICD-10-CM

## 2017-03-20 ENCOUNTER — Other Ambulatory Visit: Payer: Self-pay | Admitting: Obstetrics and Gynecology

## 2017-03-20 DIAGNOSIS — Z1231 Encounter for screening mammogram for malignant neoplasm of breast: Secondary | ICD-10-CM

## 2017-06-26 ENCOUNTER — Ambulatory Visit
Admission: RE | Admit: 2017-06-26 | Discharge: 2017-06-26 | Disposition: A | Discharge status: Home / Self Care | Payer: BLUE CROSS/BLUE SHIELD | Source: Ambulatory Visit | Attending: Obstetrics and Gynecology | Admitting: Obstetrics and Gynecology

## 2017-06-26 DIAGNOSIS — Z1231 Encounter for screening mammogram for malignant neoplasm of breast: Secondary | ICD-10-CM

## 2018-04-16 ENCOUNTER — Other Ambulatory Visit: Payer: Self-pay | Admitting: Obstetrics and Gynecology

## 2018-04-16 DIAGNOSIS — Z1231 Encounter for screening mammogram for malignant neoplasm of breast: Secondary | ICD-10-CM

## 2018-06-30 ENCOUNTER — Inpatient Hospital Stay: Admission: RE | Admit: 2018-06-30 | Payer: BLUE CROSS/BLUE SHIELD | Source: Ambulatory Visit

## 2018-11-02 ENCOUNTER — Other Ambulatory Visit: Payer: Self-pay

## 2018-11-02 ENCOUNTER — Encounter: Payer: Self-pay | Admitting: Podiatry

## 2018-11-02 ENCOUNTER — Ambulatory Visit (INDEPENDENT_AMBULATORY_CARE_PROVIDER_SITE_OTHER): Payer: 59 | Admitting: Podiatry

## 2018-11-02 VITALS — BP 108/67 | HR 76

## 2018-11-02 DIAGNOSIS — B351 Tinea unguium: Secondary | ICD-10-CM | POA: Diagnosis not present

## 2018-11-02 DIAGNOSIS — L603 Nail dystrophy: Secondary | ICD-10-CM

## 2018-11-02 NOTE — Progress Notes (Signed)
Subjective:   Patient ID: Angela Mayo, female   DOB: 53 y.o.   MRN: [id number]   HPI 53 year old female presents the office today for concerns of her right big toenail splitting as well as a green color to the nail.  She states that she plays tennis about once a week.  She was getting a pedicure when they noticed this and the person that was giving her the pedicure try to trim the nail and she was told to put alcohol on it for couple of days.  As is not getting better she presents today for further evaluation.   Review of Systems  All other systems reviewed and are negative.  Past Medical History:  Diagnosis Date  . Allergy   . Benign fundic gland polyps of stomach   . Beta thalassemia ([place])   . Chronic headaches   . Endometriosis   . In vitro fertilization   . Insomnia   . Seasonal allergies   . Thalassemia     Past Surgical History:  Procedure Laterality Date  . CESAREAN SECTION    . in vitro fertilization procedure    . umbilcal laproscopy    . UPPER GASTROINTESTINAL ENDOSCOPY    . WISDOM TOOTH EXTRACTION       Current Outpatient Medications:  .  meloxicam (MOBIC) 7.5 MG tablet, 1 po bid prn, Disp: , Rfl:  .  OVER THE COUNTER MEDICATION, Digestive enzymes, Disp: , Rfl:  .  traZODone (DESYREL) 150 MG tablet, Take by mouth at bedtime., Disp: , Rfl:   No Known Allergies     Objective:  Physical Exam  General: AAO x3, NAD  Dermatological: The right hallux toenail appears to be swelling on the distal aspect and there is some discoloration present of the nail.  Appears to have a green almost a bruised type appearance to it.  There is no pain in the nail upon palpation is no edema, erythema, drainage or pus.  The other toenails without discomfort or significant pathology.  Vascular: Dorsalis Pedis artery and Posterior Tibial artery pedal pulses are 2/4 bilateral with immedate capillary fill time. PThere is no pain with calf compression, swelling, warmth, erythema.    Neruologic: Grossly intact via light touch bilateral.Protective threshold with Semmes Wienstein monofilament intact to all pedal sites bilateral.   Musculoskeletal: No gross boney pedal deformities bilateral. No pain, crepitus, or limitation noted with foot and ankle range of motion bilateral. Muscular strength 5/5 in all groups tested bilateral.  Gait: Unassisted, Nonantalgic.      Assessment:   53 year old female right hallux onychodystrophy, discoloration     Plan:  -Treatment options discussed including all alternatives, risks, and complications -Etiology of symptoms were discussed -Likely due to damage however I did debride the toenails today that was loose and I sent this for pathology to [facility] labs for evaluation of the nail.  In the meantime we discussed we can continue with tea tree oil as well as finger soaks and I will get the results of the pathology.  Upon debridement of the area was able to remove most of the discoloration.  There is no extension into the underlying nail bed.  Trula Slade DPM

## 2018-11-16 ENCOUNTER — Telehealth: Payer: Self-pay | Admitting: *Deleted

## 2018-11-16 MED ORDER — NONFORMULARY OR COMPOUNDED ITEM: 5 refills | Status: DC

## 2018-11-16 NOTE — Telephone Encounter (Signed)
Left message requesting a call back to discuss results. 

## 2018-11-16 NOTE — Telephone Encounter (Signed)
Pt called and I informed pt of Dr. Leigh Aurora review of results and orders. Pt states she would like to do the topical and I informed [place] [phone], they would call with coverage and delivery.

## 2018-11-16 NOTE — Telephone Encounter (Signed)
-----   Message from Trula Slade, DPM sent at 11/16/2018  7:26 AM EST ----- Val- please let her know that the culture did show a fungus. I would start with a topical. Can you please order the topical through [facility] for nail fungus? We can always consider oral but I think she would do well with topical. Please let her know. Thanks.

## 2019-07-20 DIAGNOSIS — R519 Headache, unspecified: Secondary | ICD-10-CM | POA: Insufficient documentation

## 2019-12-29 ENCOUNTER — Telehealth: Payer: Self-pay | Admitting: *Deleted

## 2019-12-29 NOTE — Telephone Encounter (Signed)
Pt states she was seen in-office about a year ago for injuries to toenails during tennis, and 1 of the toenails is now green with a creamy appearance, tender to be pressed, and no drainage.

## 2019-12-29 NOTE — Telephone Encounter (Signed)
Left message for pt to make an appt, she may have an infection beneath the toenail.

## 2019-12-30 NOTE — Telephone Encounter (Signed)
Pt called and states the toenail looks like it has an infection and she didn't know if Dr. Jacqualyn Posey wanted to see her. I told pt that if the toenail looked like it had an infection he would want to see her, and she had not been evaluated in a year.

## 2019-12-30 NOTE — Telephone Encounter (Signed)
Pt called states she has more questions.

## 2019-12-30 NOTE — Telephone Encounter (Signed)
Left message informing pt that if she would call with her questions often I could call again with the answers.

## 2019-12-31 ENCOUNTER — Telehealth: Payer: Self-pay | Admitting: *Deleted

## 2019-12-31 ENCOUNTER — Encounter: Payer: Self-pay | Admitting: Podiatry

## 2019-12-31 ENCOUNTER — Telehealth: Payer: Self-pay | Admitting: Podiatry

## 2019-12-31 ENCOUNTER — Ambulatory Visit (INDEPENDENT_AMBULATORY_CARE_PROVIDER_SITE_OTHER): Payer: 59 | Admitting: Podiatry

## 2019-12-31 ENCOUNTER — Other Ambulatory Visit: Payer: Self-pay

## 2019-12-31 VITALS — Temp 97.8°F

## 2019-12-31 DIAGNOSIS — B351 Tinea unguium: Secondary | ICD-10-CM | POA: Diagnosis not present

## 2019-12-31 DIAGNOSIS — Z79899 Other long term (current) drug therapy: Secondary | ICD-10-CM | POA: Diagnosis not present

## 2019-12-31 NOTE — Telephone Encounter (Signed)
Left a voicemail message for patient to call me back with her PCP information and phone number.  Patient was seen today by Dr. Jacqualyn Posey. Dr. Jacqualyn Posey wants patient to have a CBC w/diff and Hepatic Function Panel done. Patient said she had lab results for this at her PCP office.   We do not have patient's correct PCP in the system. Once I receive a call back from patient, I will call PCP office up and ask for them to fax her lab work over to Dr. Jacqualyn Posey in the [place] office.  Waiting for a response.

## 2019-12-31 NOTE — Telephone Encounter (Signed)
Estill Bamberg:  Thank you. I called that office and they told me they have not done any blood work for them. They are not her PCP, It is her OBGYN.  I'm getting ready to go. I'll call them on Monday.  Thank you,  Rolly Pancake, CMA (AAMA)

## 2019-12-31 NOTE — Telephone Encounter (Signed)
Pt called Stated that her Dr. Is Dr. Tressia Danas and she did blood work around september last year.

## 2019-12-31 NOTE — Patient Instructions (Signed)
Terbinafine oral granules What is this medicine? TERBINAFINE (TER bin a feen) is an antifungal medicine. It is used to treat certain kinds of fungal or yeast infections. This medicine may be used for other purposes; ask your health care provider or pharmacist if you have questions. COMMON BRAND NAME(S): Lamisil What should I tell my health care provider before I take this medicine? They need to know if you have any of these conditions:  drink alcoholic beverages  kidney disease  liver disease  an unusual or allergic reaction to Terbinafine, other medicines, foods, dyes, or preservatives  pregnant or trying to get pregnant  breast-feeding How should I use this medicine? Take this medicine by mouth. Follow the directions on the prescription label. Hold packet with cut line on top. Shake packet gently to settle contents. Tear packet open along cut line, or use scissors to cut across line. Carefully pour the entire contents of packet onto a spoonful of a soft food, such as pudding or other soft, non-acidic food such as mashed potatoes (do NOT use applesauce or a fruit-based food). If two packets are required for each dose, you may either sprinkle the content of both packets on one spoonful of non-acidic food, or sprinkle the contents of both packets on two spoonfuls of non-acidic food. Make sure that no granules remain in the packet. Swallow the mxiture of the food and granules without chewing. Take your medicine at regular intervals. Do not take it more often than directed. Take all of your medicine as directed even if you think you are better. Do not skip doses or stop your medicine early. Contact your pediatrician or health care professional regarding the use of this medicine in children. While this medicine may be prescribed for children as young as 4 years for selected conditions, precautions do apply. Overdosage: If you think you have taken too much of this medicine contact a poison control  center or emergency room at once. NOTE: This medicine is only for you. Do not share this medicine with others. What if I miss a dose? If you miss a dose, take it as soon as you can. If it is almost time for your next dose, take only that dose. Do not take double or extra doses. What may interact with this medicine? Do not take this medicine with any of the following medications:  thioridazine This medicine may also interact with the following medications:  beta-blockers  caffeine  cimetidine  cyclosporine  MAOIs like Carbex, Eldepryl, Marplan, Nardil, and Parnate  medicines for fungal infections like fluconazole and ketoconazole  medicines for irregular heartbeat like amiodarone, flecainide and propafenone  rifampin  SSRIs like citalopram, escitalopram, fluoxetine, fluvoxamine, paroxetine and sertraline  tricyclic antidepressants like amitriptyline, clomipramine, desipramine, imipramine, nortriptyline, and others  warfarin This list may not describe all possible interactions. Give your health care provider a list of all the medicines, herbs, non-prescription drugs, or dietary supplements you use. Also tell them if you smoke, drink alcohol, or use illegal drugs. Some items may interact with your medicine. What should I watch for while using this medicine? Your doctor may monitor your liver function. Tell your doctor right away if you have nausea or vomiting, loss of appetite, stomach pain on your right upper side, yellow skin, dark urine, light stools, or are over tired. This medicine may cause serious skin reactions. They can happen weeks to months after starting the medicine. Contact your health care provider right away if you notice fevers or flu-like symptoms   with a rash. The rash may be red or purple and then turn into blisters or peeling of the skin. Or, you might notice a red rash with swelling of the face, lips or lymph nodes in your neck or under your arms. You need to take  this medicine for 6 weeks or longer to cure the fungal infection. Take your medicine regularly for as long as your doctor or health care provider tells you to. What side effects may I notice from receiving this medicine? Side effects that you should report to your doctor or health care professional as soon as possible:  allergic reactions like skin rash or hives, swelling of the face, lips, or tongue  change in vision  dark urine  fever or infection  general ill feeling or flu-like symptoms  light-colored stools  loss of appetite, nausea  rash, fever, and swollen lymph nodes  redness, blistering, peeling or loosening of the skin, including inside the mouth  right upper belly pain  unusually weak or tired  yellowing of the eyes or skin Side effects that usually do not require medical attention (report to your doctor or health care professional if they continue or are bothersome):  changes in taste  diarrhea  hair loss  muscle or joint pain  stomach upset This list may not describe all possible side effects. Call your doctor for medical advice about side effects. You may report side effects to FDA at 1-800-FDA-1088. Where should I keep my medicine? Keep out of the reach of children. Store at room temperature between 15 and 30 degrees C (59 and 86 degrees F). Throw away any unused medicine after the expiration date. NOTE: This sheet is a summary. It may not cover all possible information. If you have questions about this medicine, talk to your doctor, pharmacist, or health care provider.  2020 Elsevier/Gold Standard (2019-01-22 15:35:11)  

## 2019-12-31 NOTE — Progress Notes (Signed)
Subjective: 54 year old female presents the office today for concerns of her right big toenail becoming discolored.  She states that previously she had a discoloration she was using a compound cream the nail grew almost completely out however she took nail polish off when she noticed the color coming back.  It looks same as it did previously.  She does play tennis regularly and she does push off of that toe.  She also states that she did hit it recently about a month ago.  No pain of the nails no redness or drainage.  She just restarted using the compound cream about 3 days ago. Denies any systemic complaints such as fevers, chills, nausea, vomiting. No acute changes since last appointment, and no other complaints at this time.   Objective: AAO x3, NAD DP/PT pulses palpable bilaterally, CRT less than 3 seconds The right hallux toenail is dystrophic and there is brown, slight green/bruised appearance to the central aspect of nail.  Unable actually to debride the majority of this does appear to be superficial nail underlying nail appear to be healthy without any discoloration.  There is no drainage or pus there is no fluctuation crepitation.  There is no malodor. No pain with calf compression, swelling, warmth, erythema  Assessment: Onychomycosis  Plan: -All treatment options discussed with the patient including all alternatives, risks, complications.  -Patient reviewed the culture.  I debrided the nail with any complications or bleeding.  This time recommended to start oral treatment.  She is to continue with topical medication and I am going to reorder this to [facility] for onychomycosis.  We will also contact her primary care physician in regards to recent blood work.  I did give her a requisition for CBC and LFT if blood work not adequate by her primary care doctor.  Return in about 6 weeks (around 02/11/2020).  Trula Slade DPM

## 2020-01-04 ENCOUNTER — Telehealth: Payer: Self-pay | Admitting: *Deleted

## 2020-01-04 NOTE — Telephone Encounter (Signed)
Left a voicemail message for patient at [phone] (cell #) for them to call me back.  Her last lab results from her OBGYN were in September 2020. Dr. Jacqualyn Posey does want patient to get lab work done again to be more current.  Dr. Jacqualyn Posey wants patient to get:  CBC w/Diff & Hepatic Function Panel  I left message telling patient she needs new lab work and that I have the lab order in the [facility]. If patient has a fax #, I can fax the lab order to their fax number.  Waiting for a response.

## 2020-01-07 LAB — HEPATIC FUNCTION PANEL
AG Ratio: 1.7 (calc) (ref 1.0–2.5)
ALT: 11 U/L (ref 6–29)
AST: 13 U/L (ref 10–35)
Albumin: 4.6 g/dL (ref 3.6–5.1)
Alkaline phosphatase (APISO): 40 U/L (ref 37–153)
Bilirubin, Direct: 0.1 mg/dL (ref 0.0–0.2)
Globulin: 2.7 g/dL (calc) (ref 1.9–3.7)
Indirect Bilirubin: 0.5 mg/dL (calc) (ref 0.2–1.2)
Total Bilirubin: 0.6 mg/dL (ref 0.2–1.2)
Total Protein: 7.3 g/dL (ref 6.1–8.1)

## 2020-01-07 LAB — CBC WITH DIFFERENTIAL/PLATELET
Absolute Monocytes: 594 cells/uL (ref 200–950)
Basophils Absolute: 63 cells/uL (ref 0–200)
Basophils Relative: 0.7 %
Eosinophils Absolute: 72 cells/uL (ref 15–500)
Eosinophils Relative: 0.8 %
HCT: 42.1 % (ref 35.0–45.0)
Hemoglobin: 12.9 g/dL (ref 11.7–15.5)
Lymphs Abs: 2430 cells/uL (ref 850–3900)
MCH: 20.6 pg — ABNORMAL LOW (ref 27.0–33.0)
MCHC: 30.6 g/dL — ABNORMAL LOW (ref 32.0–36.0)
MCV: 67.1 fL — ABNORMAL LOW (ref 80.0–100.0)
Monocytes Relative: 6.6 %
Neutro Abs: 5841 cells/uL (ref 1500–7800)
Neutrophils Relative %: 64.9 %
Platelets: 251 10*3/uL (ref 140–400)
RBC: 6.27 10*6/uL — ABNORMAL HIGH (ref 3.80–5.10)
RDW: 17.5 % — ABNORMAL HIGH (ref 11.0–15.0)
Total Lymphocyte: 27 %
WBC: 9 10*3/uL (ref 3.8–10.8)

## 2020-01-12 ENCOUNTER — Telehealth: Payer: Self-pay | Admitting: *Deleted

## 2020-01-12 ENCOUNTER — Other Ambulatory Visit: Payer: Self-pay | Admitting: Podiatry

## 2020-01-12 MED ORDER — TERBINAFINE HCL 250 MG PO TABS: 250.0000 mg | ORAL_TABLET | Freq: Every day | ORAL | 0 refills | Status: DC

## 2020-01-12 NOTE — Telephone Encounter (Signed)
Patient is requesting Hepatic liver function test results completed on 01/06/20.Please return call back today if possible.

## 2020-01-12 NOTE — Telephone Encounter (Signed)
I informed pt that her medication had been called to her pharmacy.

## 2020-01-12 NOTE — Telephone Encounter (Signed)
I left message for the patient. Her liver function is normal. She can start the oral Lamisil and I sent it to the pharmacy.   I didn't get the results through my in basket but saw them in the chart.   Please let her know if she calls back.

## 2020-02-16 ENCOUNTER — Telehealth: Payer: Self-pay | Admitting: *Deleted

## 2020-02-16 DIAGNOSIS — Z79899 Other long term (current) drug therapy: Secondary | ICD-10-CM

## 2020-02-16 DIAGNOSIS — B351 Tinea unguium: Secondary | ICD-10-CM

## 2020-02-16 NOTE — Telephone Encounter (Signed)
Pt states she is to have blood work after 6 weeks of the terbinafine and would like to know how she is to get the labs.

## 2020-02-16 NOTE — Telephone Encounter (Signed)
Left message informing pt the labs would be ordered with [facility] and she could chose her time to perform the lab.

## 2020-02-21 LAB — CBC WITH DIFFERENTIAL/PLATELET
Absolute Monocytes: 398 cells/uL (ref 200–950)
Basophils Absolute: 61 cells/uL (ref 0–200)
Basophils Relative: 1.2 %
Eosinophils Absolute: 219 cells/uL (ref 15–500)
Eosinophils Relative: 4.3 %
HCT: 35.4 % (ref 35.0–45.0)
Hemoglobin: 11 g/dL — ABNORMAL LOW (ref 11.7–15.5)
Lymphs Abs: 2025 cells/uL (ref 850–3900)
MCH: 20.5 pg — ABNORMAL LOW (ref 27.0–33.0)
MCHC: 31.1 g/dL — ABNORMAL LOW (ref 32.0–36.0)
MCV: 66 fL — ABNORMAL LOW (ref 80.0–100.0)
Monocytes Relative: 7.8 %
Neutro Abs: 2397 cells/uL (ref 1500–7800)
Neutrophils Relative %: 47 %
Platelets: 237 10*3/uL (ref 140–400)
RBC: 5.36 10*6/uL — ABNORMAL HIGH (ref 3.80–5.10)
RDW: 15.9 % — ABNORMAL HIGH (ref 11.0–15.0)
Total Lymphocyte: 39.7 %
WBC: 5.1 10*3/uL (ref 3.8–10.8)

## 2020-02-21 LAB — HEPATIC FUNCTION PANEL
AG Ratio: 1.9 (calc) (ref 1.0–2.5)
ALT: 9 U/L (ref 6–29)
AST: 14 U/L (ref 10–35)
Albumin: 4.3 g/dL (ref 3.6–5.1)
Alkaline phosphatase (APISO): 32 U/L — ABNORMAL LOW (ref 37–153)
Bilirubin, Direct: 0.1 mg/dL (ref 0.0–0.2)
Globulin: 2.3 g/dL (calc) (ref 1.9–3.7)
Indirect Bilirubin: 0.4 mg/dL (calc) (ref 0.2–1.2)
Total Bilirubin: 0.5 mg/dL (ref 0.2–1.2)
Total Protein: 6.6 g/dL (ref 6.1–8.1)

## 2020-02-24 ENCOUNTER — Telehealth: Payer: Self-pay | Admitting: *Deleted

## 2020-02-24 NOTE — Telephone Encounter (Signed)
Left message informing pt of Dr. Wagoner's review of results. 

## 2020-02-24 NOTE — Telephone Encounter (Signed)
-----   Message from Trula Slade, DPM sent at 02/23/2020  6:02 PM EDT ----- Val- please let her know that the labs are stable and she can finish the course of Lamisil.

## 2020-04-04 ENCOUNTER — Telehealth: Payer: Self-pay | Admitting: *Deleted

## 2020-04-04 NOTE — Telephone Encounter (Signed)
Pt states she is about out of the terbinafine and would like a refill there is just a very little bit of brown on the edge of the toenail, and she has bad insurance and doesn't want to have to come in to be told she just needs another refill.

## 2020-04-05 NOTE — Telephone Encounter (Signed)
Called and left a message for the patient to call me back and to have a picture sent to my work email address. Lattie Haw

## 2020-04-05 NOTE — Telephone Encounter (Signed)
Angela Mayo- can you please have her send a picture of the toenail if able? Thanks

## 2020-04-07 ENCOUNTER — Other Ambulatory Visit: Payer: Self-pay | Admitting: *Deleted

## 2020-04-07 DIAGNOSIS — Z79899 Other long term (current) drug therapy: Secondary | ICD-10-CM

## 2020-04-07 NOTE — Telephone Encounter (Signed)
Pt called lvm  stating she had blood work done in between the 90 day that shes been taking medication she also stated she had 7pills left she wanted to know when the medication would be filled also what pharmacy she stated she received the last one from [place] please assist

## 2020-04-07 NOTE — Telephone Encounter (Signed)
Per Dr Debe Coder called the patient and stated that Dr Jacqualyn Posey would like for the patient to get the cbc and the hepatic function blood work done and we can restart the medicine and I stated did the patient finish the 90 day supply and asked the patient to call me back at [phone]. Angela Mayo

## 2020-04-07 NOTE — Telephone Encounter (Signed)
Patient is calling to speak with Lattie Haw  to confirm the photo she has sent of her foot. Please return call to let her know that we have received.

## 2020-04-10 ENCOUNTER — Telehealth: Payer: Self-pay | Admitting: *Deleted

## 2020-04-10 NOTE — Telephone Encounter (Addendum)
Pt called and states she has been playing phone tag with Lattie Haw, and needs to know if she needs the labs and if he will call it in prior to her getting the results and she is out of time and she does not want to be without the medication and would need to know the address again and would like to make sure we are using the proper pharmacy and she found it cheaper at [facility] in [place]. I told pt I did see that orders were put in for labs and she could have them performed at [address]. I told pt the medication stayed in her system for up to 4 weeks. Pt states she only has 4 pills left.

## 2020-04-10 NOTE — Telephone Encounter (Signed)
Called and left a message on Friday June 11th, 2021 for the patient stating that per Dr Jacqualyn Posey we would need to do a cbc and hepatic function and then we can restart the medicine but Dr Jacqualyn Posey just wanted to see if the patient finished the 90 day supply and then the patient calls today and leaves another message and I did call and left the patient another message to call me back. Lattie Haw

## 2020-04-12 LAB — CBC WITH DIFFERENTIAL/PLATELET
Absolute Monocytes: 538 cells/uL (ref 200–950)
Basophils Absolute: 47 cells/uL (ref 0–200)
Basophils Relative: 0.6 %
Eosinophils Absolute: 78 cells/uL (ref 15–500)
Eosinophils Relative: 1 %
HCT: 38.7 % (ref 35.0–45.0)
Hemoglobin: 11.6 g/dL — ABNORMAL LOW (ref 11.7–15.5)
Lymphs Abs: 2434 cells/uL (ref 850–3900)
MCH: 20.5 pg — ABNORMAL LOW (ref 27.0–33.0)
MCHC: 30 g/dL — ABNORMAL LOW (ref 32.0–36.0)
MCV: 68.3 fL — ABNORMAL LOW (ref 80.0–100.0)
Monocytes Relative: 6.9 %
Neutro Abs: 4703 cells/uL (ref 1500–7800)
Neutrophils Relative %: 60.3 %
Platelets: 238 10*3/uL (ref 140–400)
RBC: 5.67 10*6/uL — ABNORMAL HIGH (ref 3.80–5.10)
RDW: 16.3 % — ABNORMAL HIGH (ref 11.0–15.0)
Total Lymphocyte: 31.2 %
WBC: 7.8 10*3/uL (ref 3.8–10.8)

## 2020-04-12 LAB — HEPATIC FUNCTION PANEL
AG Ratio: 1.9 (calc) (ref 1.0–2.5)
ALT: 11 U/L (ref 6–29)
AST: 11 U/L (ref 10–35)
Albumin: 4.5 g/dL (ref 3.6–5.1)
Alkaline phosphatase (APISO): 31 U/L — ABNORMAL LOW (ref 37–153)
Bilirubin, Direct: 0.1 mg/dL (ref 0.0–0.2)
Globulin: 2.4 g/dL (calc) (ref 1.9–3.7)
Indirect Bilirubin: 0.5 mg/dL (calc) (ref 0.2–1.2)
Total Bilirubin: 0.6 mg/dL (ref 0.2–1.2)
Total Protein: 6.9 g/dL (ref 6.1–8.1)

## 2020-04-13 ENCOUNTER — Telehealth: Payer: Self-pay | Admitting: Podiatry

## 2020-04-13 NOTE — Telephone Encounter (Signed)
Pt called stating she had completed all blood work and it all came back and she wanted to know if the fungal medication was to be called in please advise

## 2020-04-14 ENCOUNTER — Telehealth: Payer: Self-pay | Admitting: *Deleted

## 2020-04-14 ENCOUNTER — Other Ambulatory Visit: Payer: Self-pay | Admitting: Podiatry

## 2020-04-14 MED ORDER — TERBINAFINE HCL 250 MG PO TABS: 250.0000 mg | ORAL_TABLET | Freq: Every day | ORAL | 0 refills | Status: DC

## 2020-04-14 MED ORDER — TERBINAFINE HCL 250 MG PO TABS: 250.0000 mg | ORAL_TABLET | Freq: Every day | ORAL | 0 refills | Status: AC

## 2020-04-14 NOTE — Telephone Encounter (Signed)
Labs are OK. I have sent 30 days of the lamisil to the pharmacy.

## 2020-04-14 NOTE — Telephone Encounter (Signed)
-----   Message from Trula Slade, DPM sent at 04/14/2020  8:30 AM EDT ----- Labs are OK. I have sent 30 days of the lamisil to the pharmacy.

## 2020-04-14 NOTE — Telephone Encounter (Signed)
Called and spoke with the patient today and relayed the message per Dr Jacqualyn Posey. Angela Mayo

## 2020-04-14 NOTE — Telephone Encounter (Signed)
Pt called and states the medication was called to the wrong pharmacy. I told pt I would send to the [facility] on [address] in [place].

## 2020-04-14 NOTE — Telephone Encounter (Signed)
Left message informing pt of Dr. Wagoner's review of results and orders. 

## 2020-04-14 NOTE — Addendum Note (Signed)
Addended by: Harriett Sine D on: 04/14/2020 09:14 AM   Modules accepted: Orders

## 2020-07-27 ENCOUNTER — Other Ambulatory Visit: Payer: Self-pay | Admitting: Obstetrics and Gynecology

## 2020-07-27 DIAGNOSIS — R928 Other abnormal and inconclusive findings on diagnostic imaging of breast: Secondary | ICD-10-CM

## 2020-08-04 ENCOUNTER — Other Ambulatory Visit: Payer: Self-pay

## 2020-08-04 ENCOUNTER — Ambulatory Visit: Payer: Self-pay

## 2020-08-04 ENCOUNTER — Ambulatory Visit
Admission: RE | Admit: 2020-08-04 | Discharge: 2020-08-04 | Disposition: A | Discharge status: Home / Self Care | Payer: Self-pay | Source: Ambulatory Visit | Attending: Obstetrics and Gynecology | Admitting: Obstetrics and Gynecology

## 2020-08-04 DIAGNOSIS — R928 Other abnormal and inconclusive findings on diagnostic imaging of breast: Secondary | ICD-10-CM

## 2020-08-09 ENCOUNTER — Other Ambulatory Visit: Payer: Self-pay

## 2021-05-04 ENCOUNTER — Other Ambulatory Visit: Payer: Self-pay | Admitting: Obstetrics and Gynecology

## 2021-05-04 DIAGNOSIS — Z1231 Encounter for screening mammogram for malignant neoplasm of breast: Secondary | ICD-10-CM

## 2021-07-27 ENCOUNTER — Ambulatory Visit
Admission: RE | Admit: 2021-07-27 | Discharge: 2021-07-27 | Disposition: A | Discharge status: Home / Self Care | Payer: 59 | Source: Ambulatory Visit | Attending: Obstetrics and Gynecology | Admitting: Obstetrics and Gynecology

## 2021-07-27 ENCOUNTER — Other Ambulatory Visit: Payer: Self-pay

## 2021-07-27 DIAGNOSIS — Z1231 Encounter for screening mammogram for malignant neoplasm of breast: Secondary | ICD-10-CM

## 2021-08-03 ENCOUNTER — Other Ambulatory Visit: Payer: Self-pay | Admitting: Obstetrics and Gynecology

## 2021-08-03 DIAGNOSIS — R928 Other abnormal and inconclusive findings on diagnostic imaging of breast: Secondary | ICD-10-CM

## 2021-08-21 ENCOUNTER — Other Ambulatory Visit: Payer: Self-pay

## 2021-08-21 ENCOUNTER — Ambulatory Visit
Admission: RE | Admit: 2021-08-21 | Discharge: 2021-08-21 | Disposition: A | Discharge status: Home / Self Care | Payer: 59 | Source: Ambulatory Visit | Attending: Obstetrics and Gynecology | Admitting: Obstetrics and Gynecology

## 2021-08-21 DIAGNOSIS — R928 Other abnormal and inconclusive findings on diagnostic imaging of breast: Secondary | ICD-10-CM

## 2021-11-03 IMAGING — MG MM DIGITAL DIAGNOSTIC UNILAT*L* W/ TOMO W/ CAD
4 series · 4 of 12 positions shown · non-contrast
Comparison: Previous exams including recent screening mammogram
dated 07/27/2021.

CLINICAL DATA: Patient returns today to evaluate a possible LEFT
breast asymmetry questioned on recent screening mammogram.

EXAM:
DIGITAL DIAGNOSTIC UNILATERAL LEFT MAMMOGRAM WITH TOMOSYNTHESIS AND
CAD; ULTRASOUND LEFT BREAST LIMITED
TECHNIQUE: Left digital diagnostic mammography and breast tomosynthesis was
performed. The images were evaluated with computer-aided detection.;
Targeted ultrasound examination of the left breast was performed.

[L CC synth-2D]
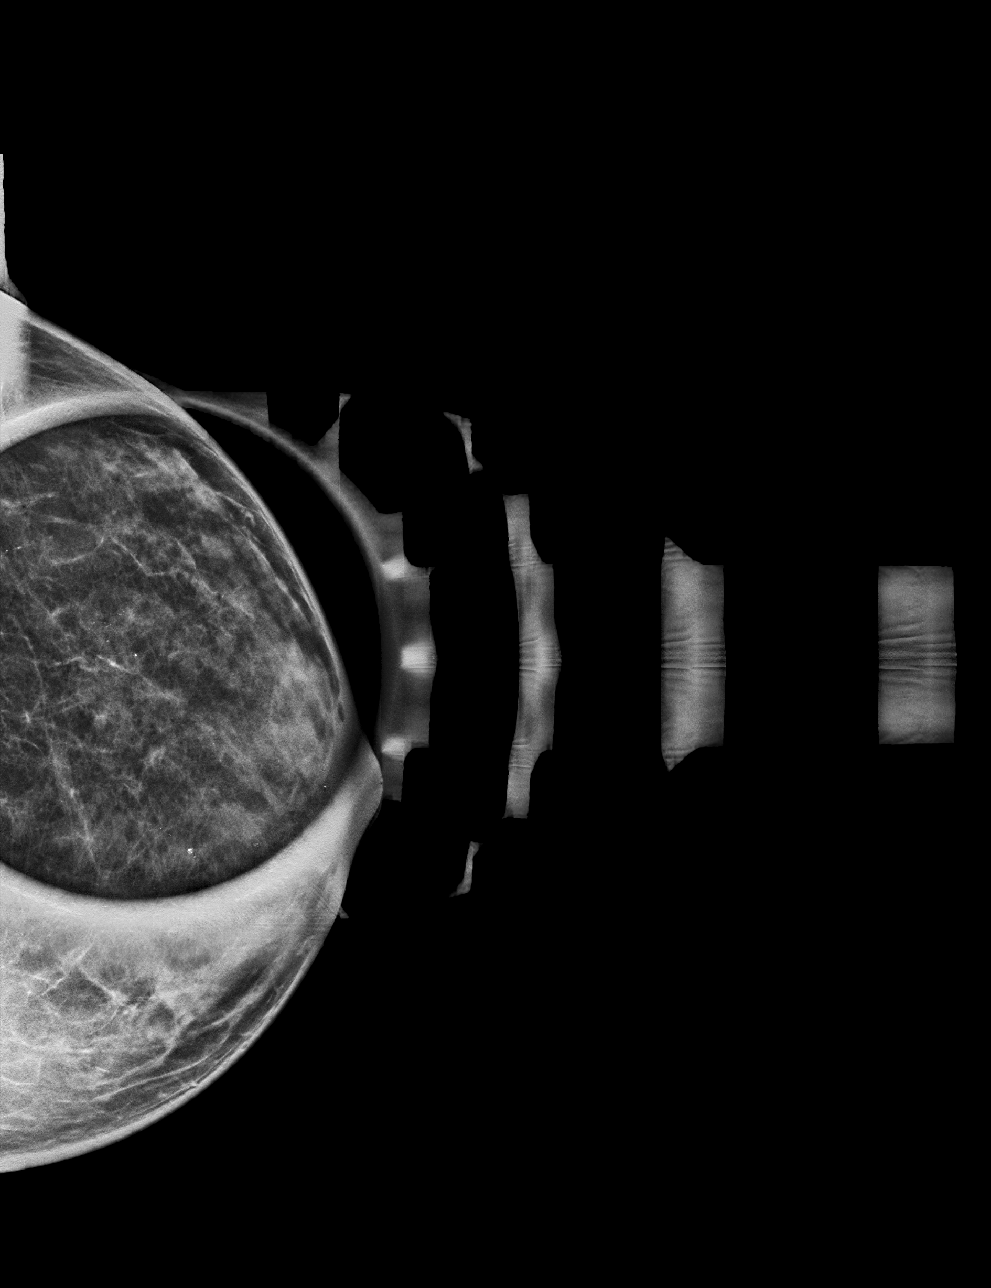

[L MLO synth-2D]
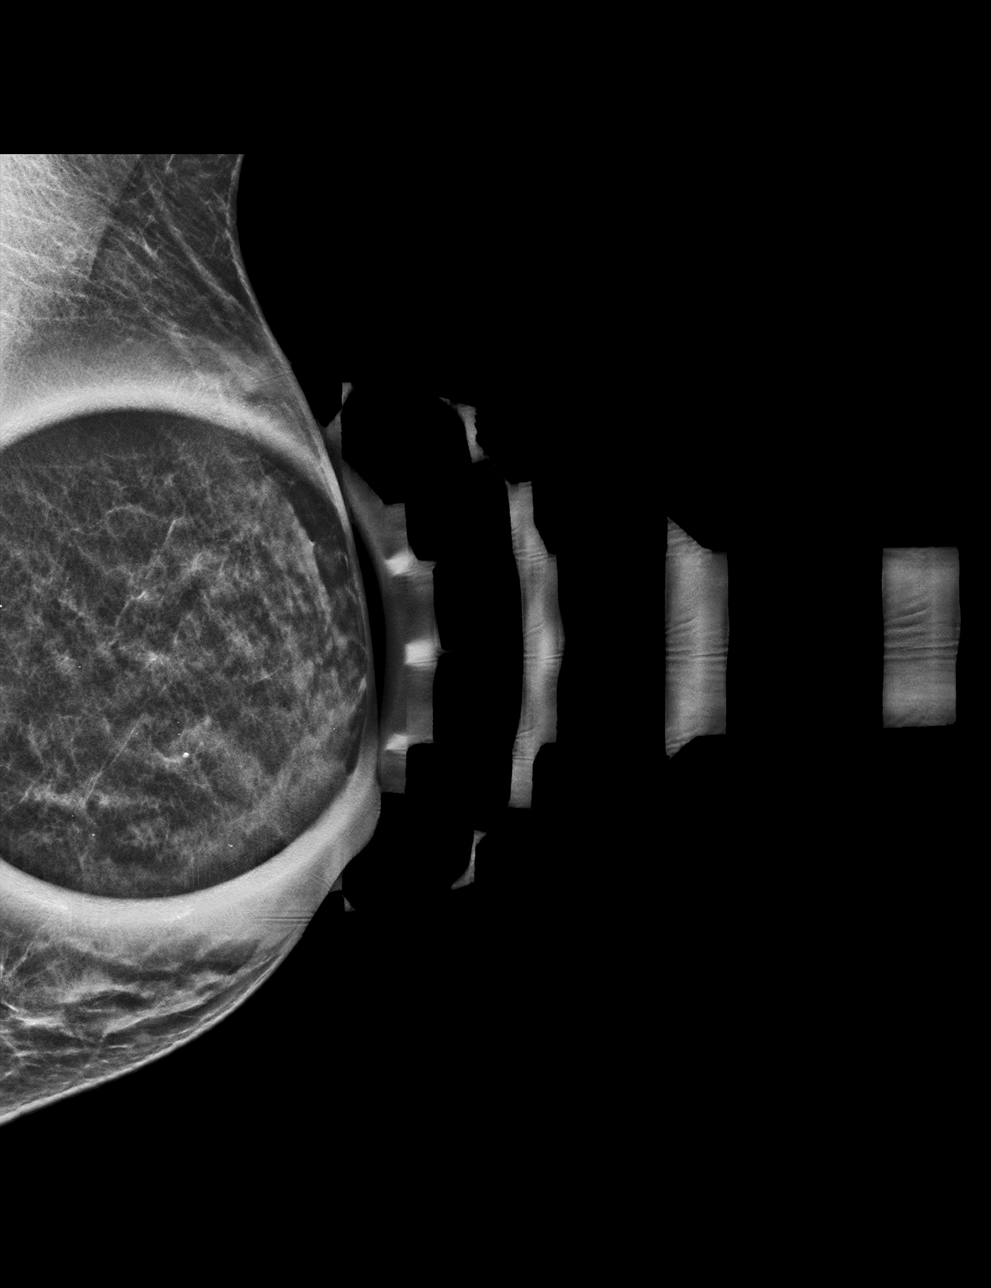

[L CC tomo · tomo slice 18/35.0]
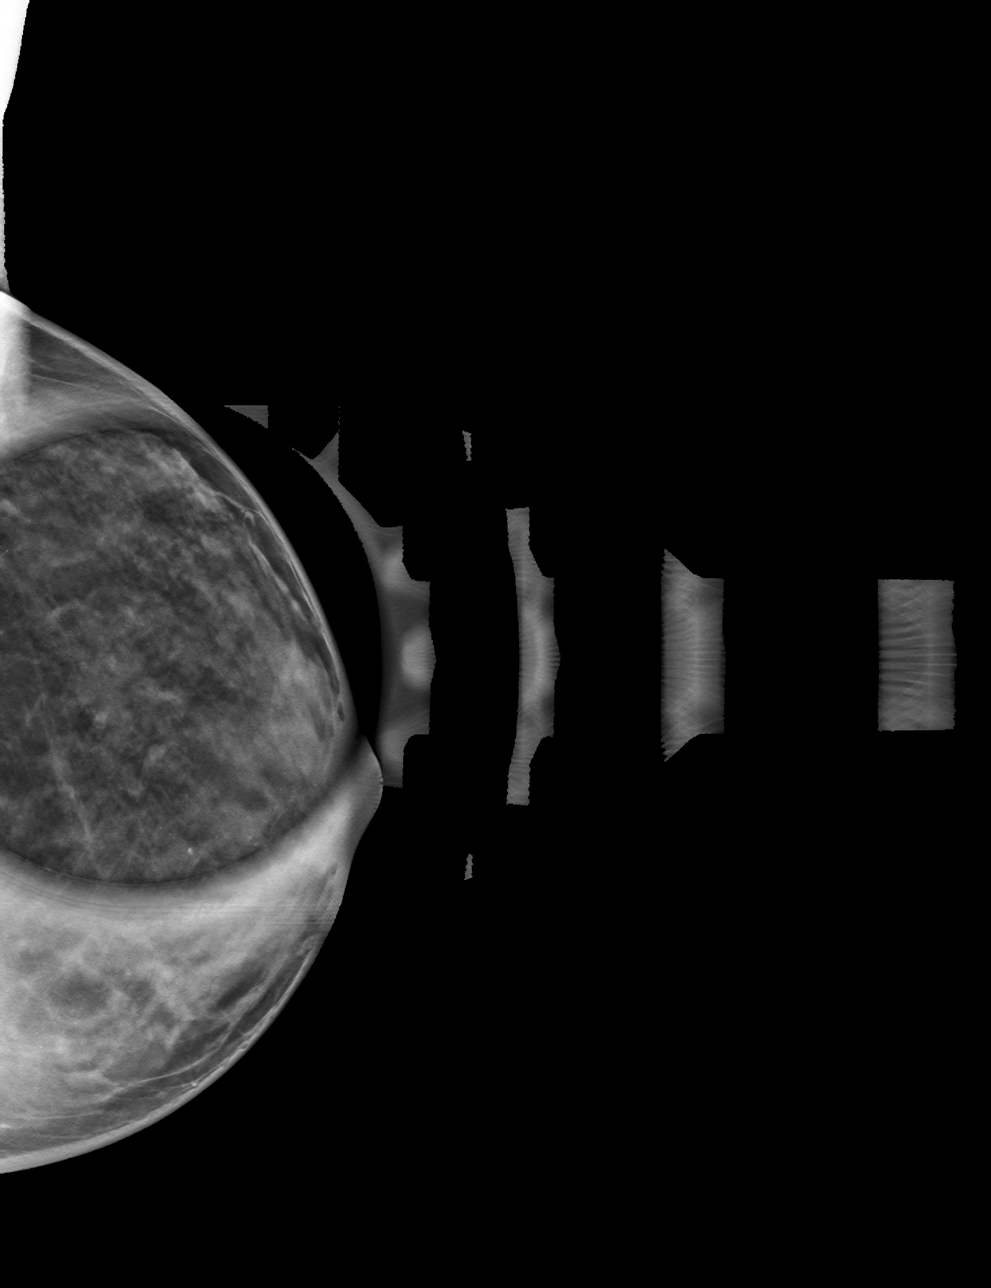

[L MLO tomo · tomo slice 19/37.0]
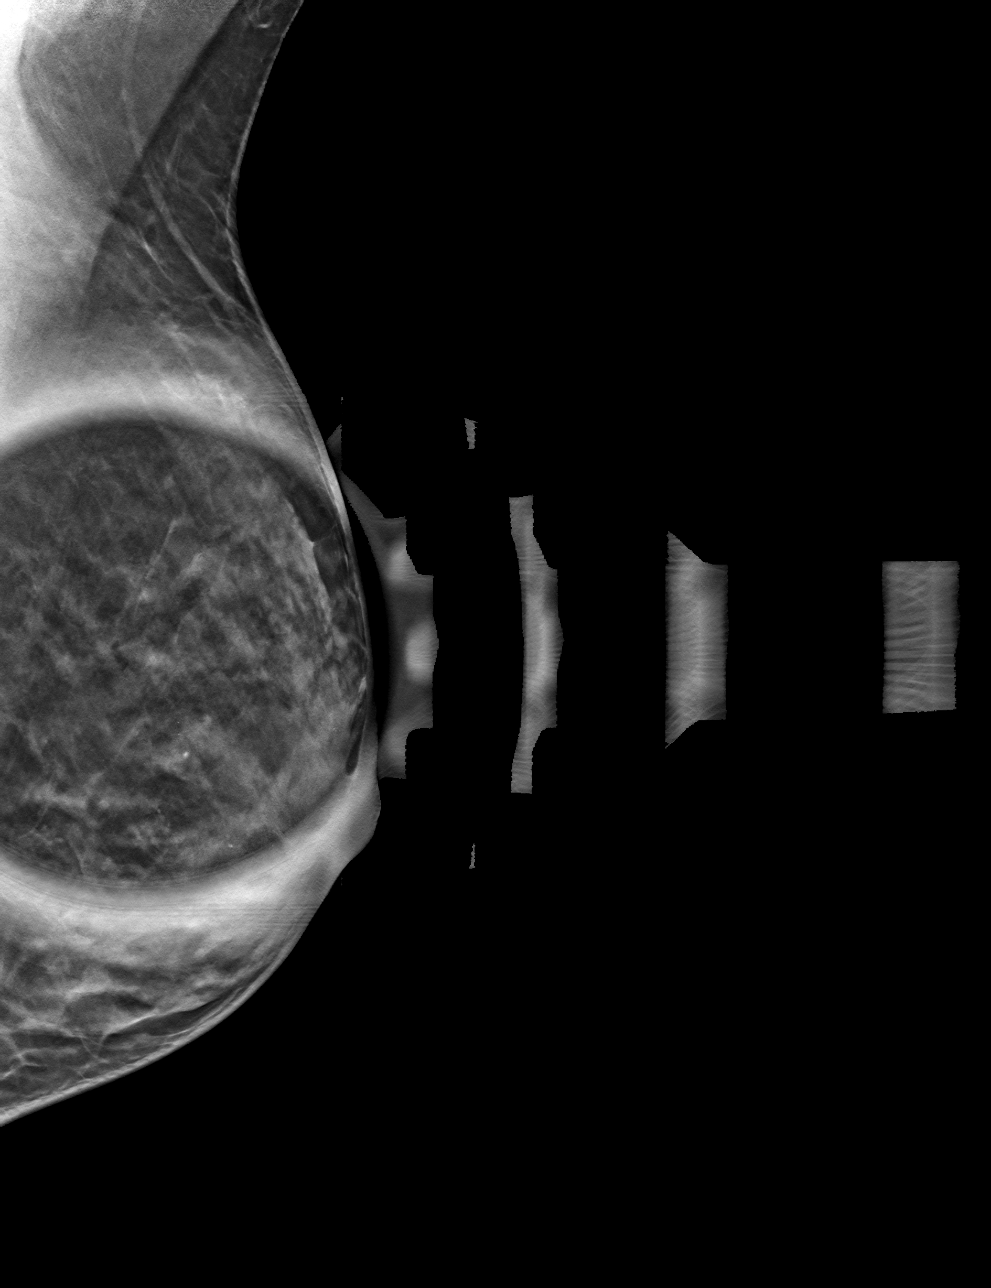

[4 of 12 positions shown; findings below may reference images not displayed]

ACR Breast Density Category c: The breast tissue is heterogeneously
dense, which may obscure small masses.
FINDINGS: LEFT breast diagnostic mammogram: On today's additional diagnostic
views with spot compression and 3D tomosynthesis, a partially
obscured mass is confirmed within the lower outer quadrant of the
LEFT breast.

Targeted ultrasound is performed, showing 2 adjacent benign cysts
within the LEFT breast at the 5:30 o'clock axis, measuring 4 mm and
3 mm, corresponding to the mammographic findings. No suspicious
solid or cystic mass is seen by ultrasound within the lower LEFT
breast.
IMPRESSION: No evidence of malignancy. Two adjacent benign cysts within the LEFT
breast at the 5:30 o'clock axis, measuring 4 mm and 3 mm
respectively, corresponding to the mammographic findings.

Patient may return to routine annual bilateral screening mammogram
schedule.

RECOMMENDATION:
Screening mammogram in one year.(Code:[id number])

I have discussed the findings and recommendations with the patient.
If applicable, a reminder letter will be sent to the patient
regarding the next appointment.

BI-RADS CATEGORY  2: Benign.

## 2022-06-20 ENCOUNTER — Other Ambulatory Visit: Payer: Self-pay | Admitting: Obstetrics and Gynecology

## 2022-06-20 DIAGNOSIS — Z1231 Encounter for screening mammogram for malignant neoplasm of breast: Secondary | ICD-10-CM

## 2022-09-12 ENCOUNTER — Ambulatory Visit
Admission: RE | Admit: 2022-09-12 | Discharge: 2022-09-12 | Disposition: A | Discharge status: Home / Self Care | Payer: Commercial Managed Care - PPO | Source: Ambulatory Visit | Attending: Obstetrics and Gynecology | Admitting: Obstetrics and Gynecology

## 2022-09-12 DIAGNOSIS — Z1231 Encounter for screening mammogram for malignant neoplasm of breast: Secondary | ICD-10-CM
